# Patient Record
Sex: Male | Born: 1995 | Race: Asian | Hispanic: No | Marital: Single | Smoking: Never smoker
Health system: Southern US, Community
[De-identification: ages and names within clinical notes are randomized; demographics above are authoritative.]

---

## 2014-10-30 ENCOUNTER — Encounter: Payer: Self-pay | Admitting: Emergency Medicine

## 2014-10-30 ENCOUNTER — Emergency Department
Admission: EM | Admit: 2014-10-30 | Discharge: 2014-10-30 | Disposition: A | Discharge status: Home / Self Care | Payer: BLUE CROSS/BLUE SHIELD | Attending: Emergency Medicine | Admitting: Emergency Medicine

## 2014-10-30 ENCOUNTER — Emergency Department: Payer: BLUE CROSS/BLUE SHIELD

## 2014-10-30 DIAGNOSIS — N50812 Left testicular pain: Secondary | ICD-10-CM

## 2014-10-30 DIAGNOSIS — N508 Other specified disorders of male genital organs: Secondary | ICD-10-CM | POA: Insufficient documentation

## 2014-10-30 DIAGNOSIS — R52 Pain, unspecified: Secondary | ICD-10-CM

## 2014-10-30 MED ORDER — IBUPROFEN 800 MG PO TABS
800.0000 mg | ORAL_TABLET | Freq: Once | ORAL | Status: AC
  Administered 2014-10-30: 800 mg via ORAL
  Filled 2014-10-30: qty 1

## 2014-10-30 MED ORDER — IBUPROFEN 800 MG PO TABS: 800.0000 mg | ORAL_TABLET | Freq: Three times a day (TID) | ORAL | Status: AC | PRN

## 2014-10-30 MED ORDER — DOXYCYCLINE HYCLATE 100 MG PO CAPS: 100.0000 mg | ORAL_CAPSULE | Freq: Two times a day (BID) | ORAL | Status: AC

## 2014-10-30 NOTE — Discharge Instructions (Signed)
Orchitis Orchitis is an infection of the testicle of usually sudden onset (happens quickly). It may be viral or bacterial (caused by germs). Usually with this illness there is generalized malaise (not feeling well) and fever. There is also pain. There is usually tenderness and swelling of the scrotum and testicle. DIAGNOSIS  Your caregiver will perform an exam to make sEpididymitis Epididymitis is a swelling (inflammation) of the epididymis. The epididymis is a cord-like structure along the back part of the testicle. Epididymitis is usually, but not always, caused by infection. This is usually a sudden problem beginning with chills, fever and pain behind the scrotum and in the testicle. There may be swelling and redness of the testicle. DIAGNOSIS  Physical examination will reveal a tender, swollen epididymis. Sometimes, cultures are obtained from the urine or from prostate secretions to help find out if there is an infection or if the cause is a different problem. Sometimes, blood work is performed to see if your white blood cell count is elevated and if a germ (bacterial) or viral infection is present. Using this knowledge, an appropriate medicine which kills germs (antibiotic) can be chosen by your caregiver. A viral infection causing epididymitis will most often go away (resolve) without treatment. HOME CARE INSTRUCTIONS   Hot sitz baths for 20 minutes, 4 times per day, may help relieve pain.  Only take over-the-counter or prescription medicines for pain, discomfort or fever as directed by your caregiver.  Take all medicines, including antibiotics, as directed. Take the antibiotics for the full prescribed length of time even if you are feeling better.  It is very important to keep all follow-up appointments. SEEK IMMEDIATE MEDICAL CARE IF:   You have a fever.  You have pain not relieved with medicines.  You have any worsening of your problems.  Your pain seems to come and go.  You  develop pain, redness, and swelling in the scrotum and surrounding areas. MAKE SURE YOU:   Understand these instructions.  Will watch your condition.  Will get help right away if you are not doing well or get worse. Document Released: 03/19/2000 Document Revised: 06/14/2011 Document Reviewed: 02/06/2009 [facility] Patient Information 2015 [place], [place]. This information is not intended to replace advice given to you by your health care provider. Make sure you discuss any questions you have with your health care provider. e there is not another reason for the pain in your testicle. A rectal exam may be done to find out if the prostate is swollen and tender. Blood work may be done to see if your white blood cell count is elevated. This can help determine if an infection is viral or bacterial. A urinalysis can also determine what type of infection is present. Most bacterial infections can be treated with antibiotics (medications which kill germs). LET YOUR CAREGIVER KNOW ABOUT:  Allergies.  Medications taken including herbs, eye drops, over the counter medications, and creams.  Use of steroids (by mouth or creams).  Previous problems with anesthetics or novocaine.  Previous prostate infections.  History of blood clots (thrombophlebitis).  History of bleeding or blood problems.  Previous surgery.  Previous urinary tract infection.  Other health problems. HOME CARE INSTRUCTIONS   Apply cold packs to the scrotal area for twenty minutes, four times per day or as needed.  A scrotal support may be helpful. Keep a small pillow or support under your testicles while lying or sitting down.  Only take over-the-counter or prescription medicines for pain, discomfort, or fever as  directed by your caregiver.  Take all medications, including antibiotics, as directed. Take the antibiotics for the full prescribed length of time even if you are feeling better. SEEK IMMEDIATE MEDICAL CARE IF:    Your redness, swelling, or pain in the testicle increases or is not getting better.  You have a fever.  You have pain not relieved with medicines.  You have any worsening of any symptoms (problems) that originally brought you in for medical care. Document Released: 03/19/2000 Document Revised: 06/14/2011 Document Reviewed: 03/22/2005 [facility] Patient Information 2015 [place], [place]. This information is not intended to replace advice given to you by your health care provider. Make sure you discuss any questions you have with your health care provider.

## 2014-10-30 NOTE — ED Notes (Signed)
Pt brought over from [facility]
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
## 2014-10-30 NOTE — ED Notes (Signed)
Patient transported to Ultrasound 

## 2017-04-06 IMAGING — US US SCROTUM
1 series · 14 of 25 positions shown · non-contrast
Comparison: None.

CLINICAL DATA: Acute left testicular pain.

EXAM:
SCROTAL ULTRASOUND
DOPPLER ULTRASOUND OF THE TESTICLES
TECHNIQUE: Complete ultrasound examination of the testicles, epididymis, and
other scrotal structures was performed. Color and spectral Doppler
ultrasound were also utilized to evaluate blood flow to the
testicles.

[Series 1: us scrotum · 0.07mm/px · 14 of 52 slices shown]
[im 1/52]
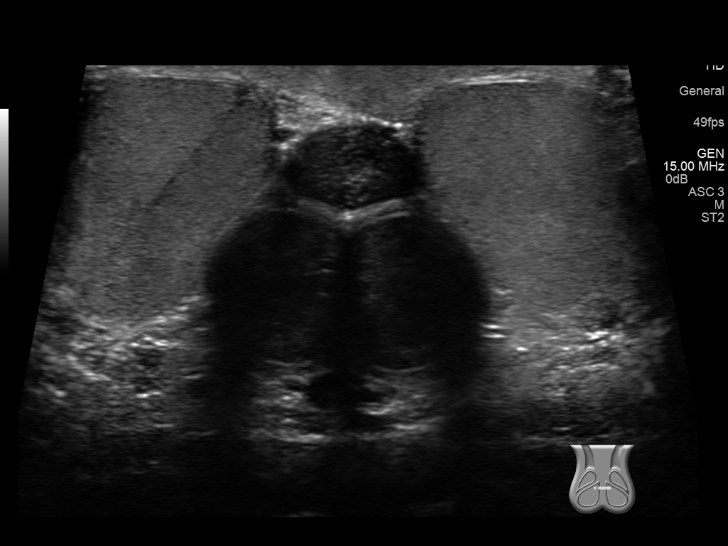
[im 5/52]
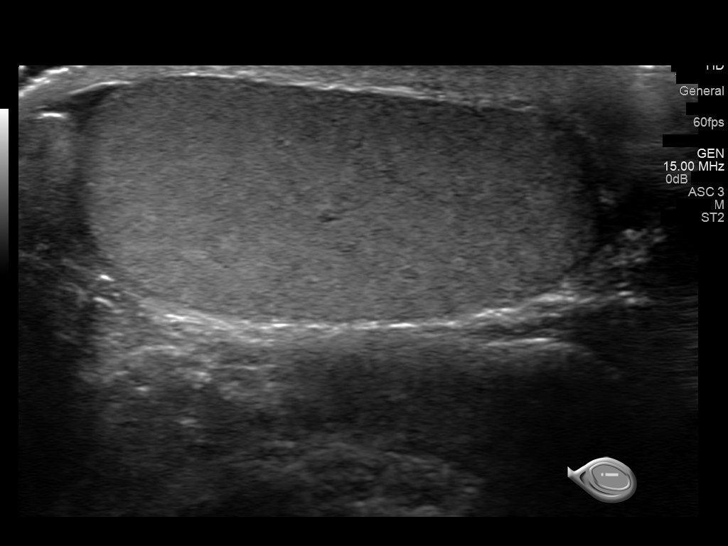
[im 9/52]
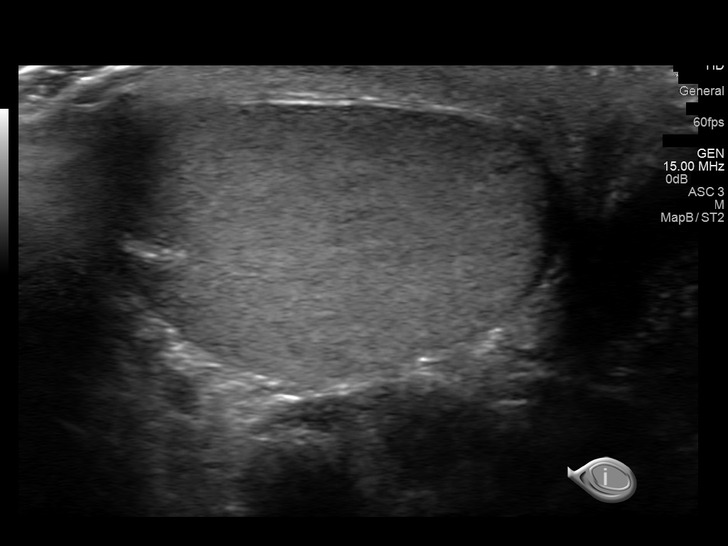
[im 13/52]
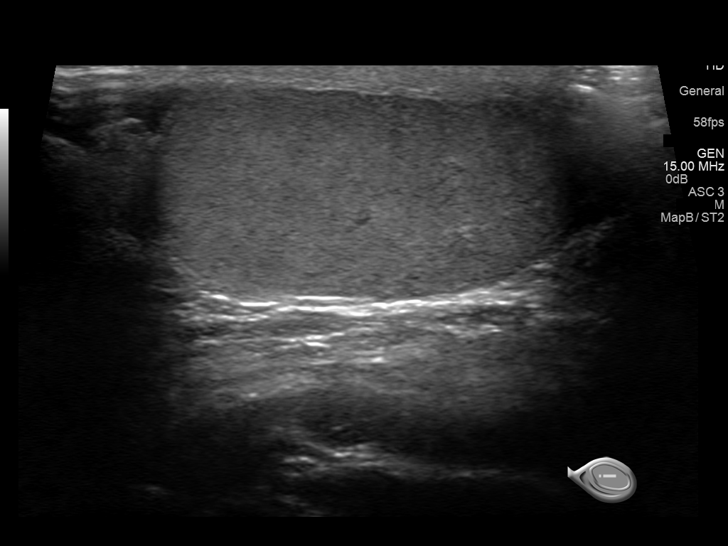
[im 18/52]
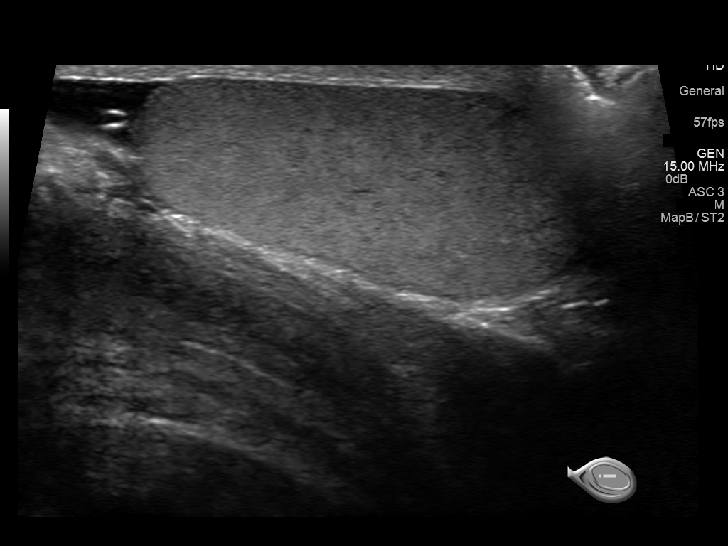
[im 20/52]
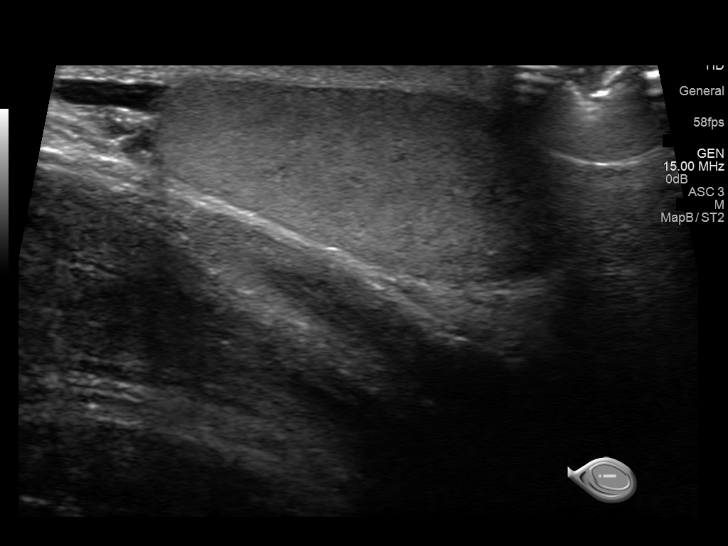
[im 24/52]
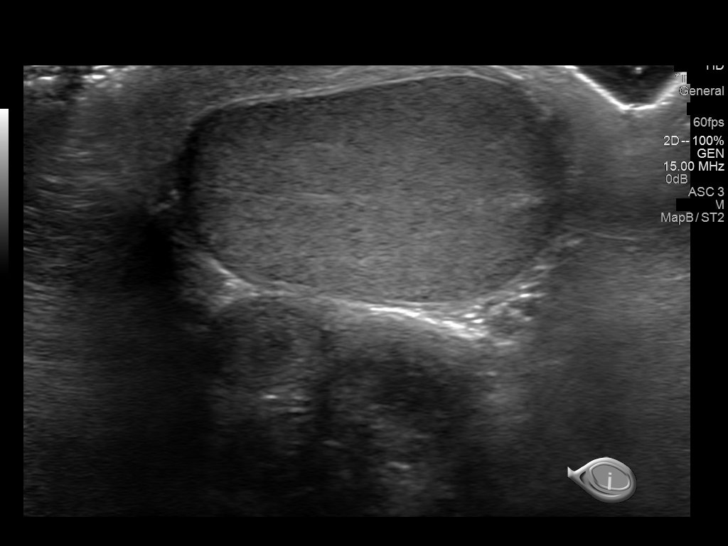
[im 28/52]
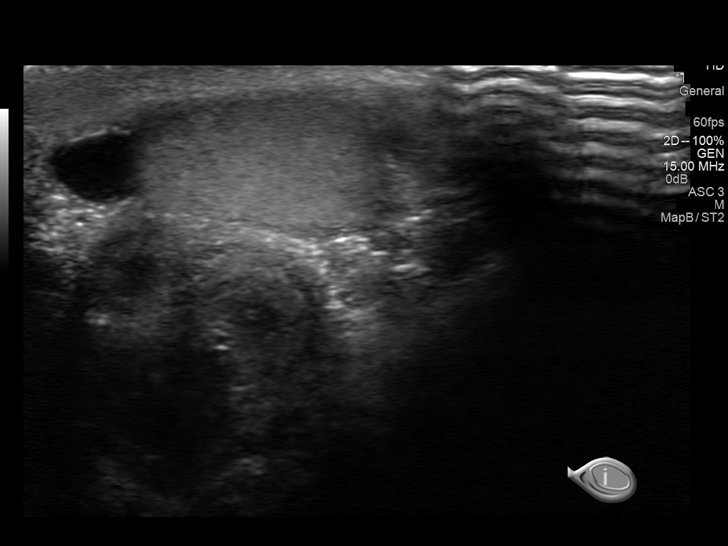
[im 32/52]
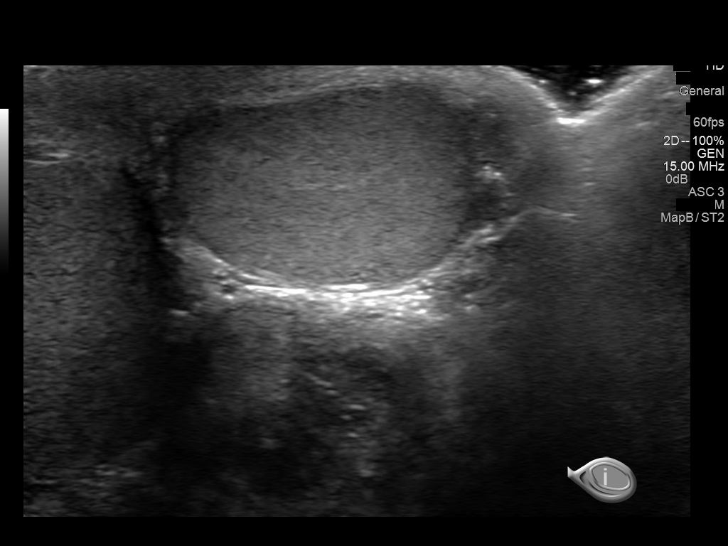
[im 35/52]
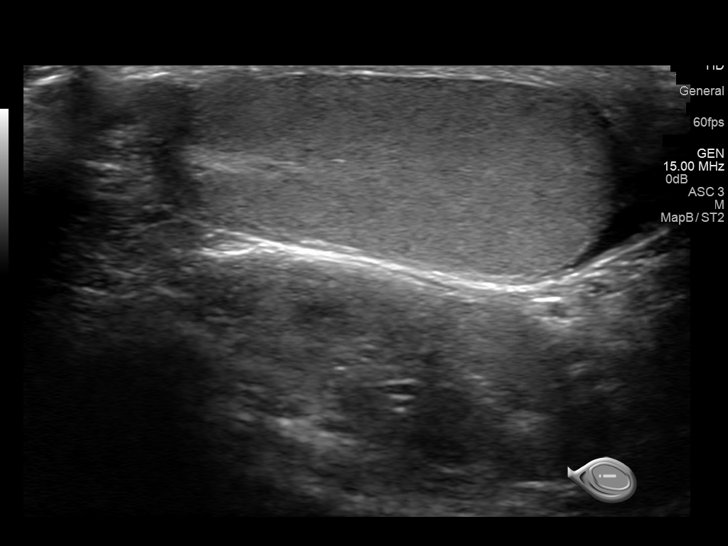
[im 39/52]
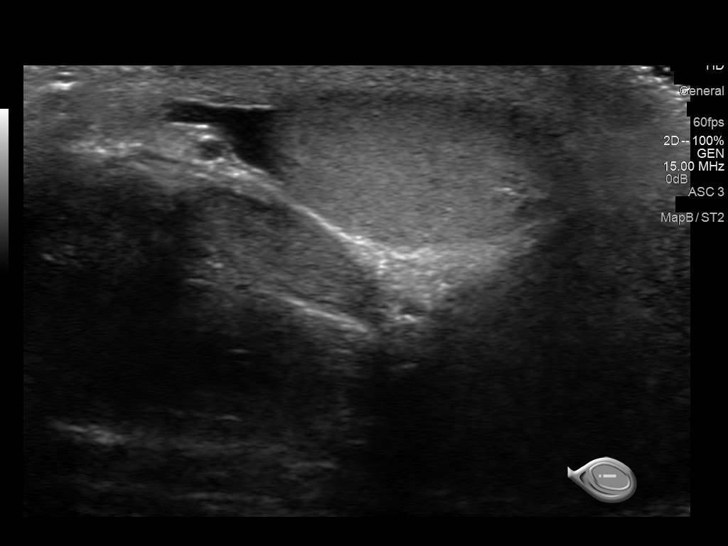
[im 43/52]
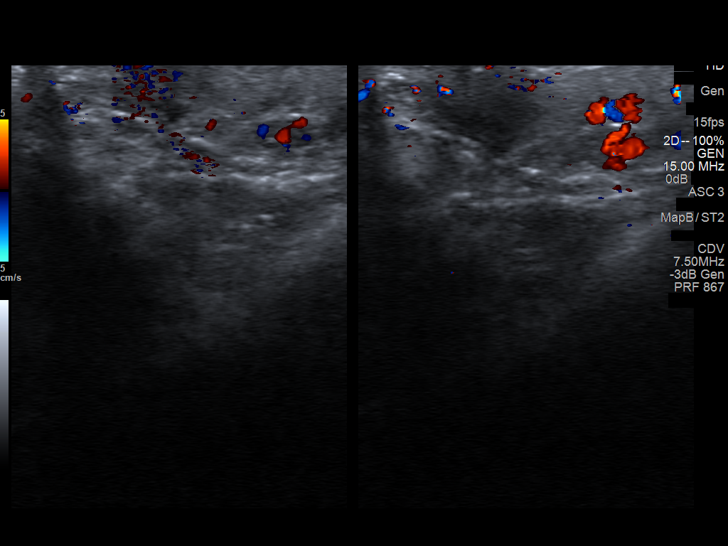
[im 47/52]
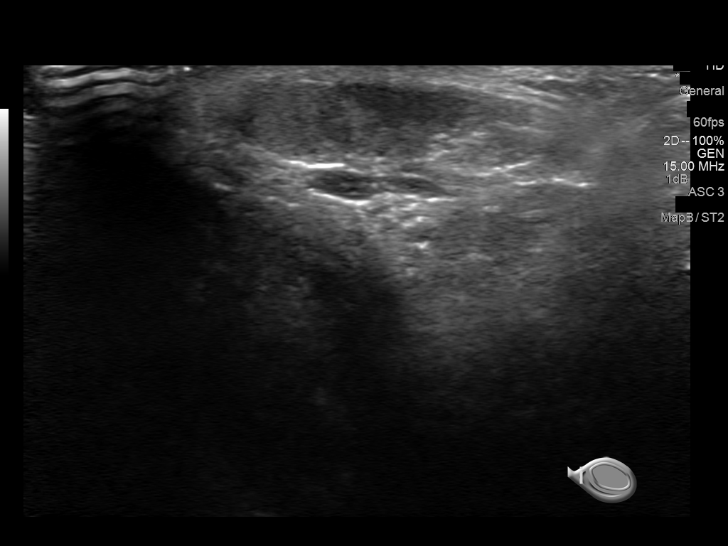
[im 52/52]
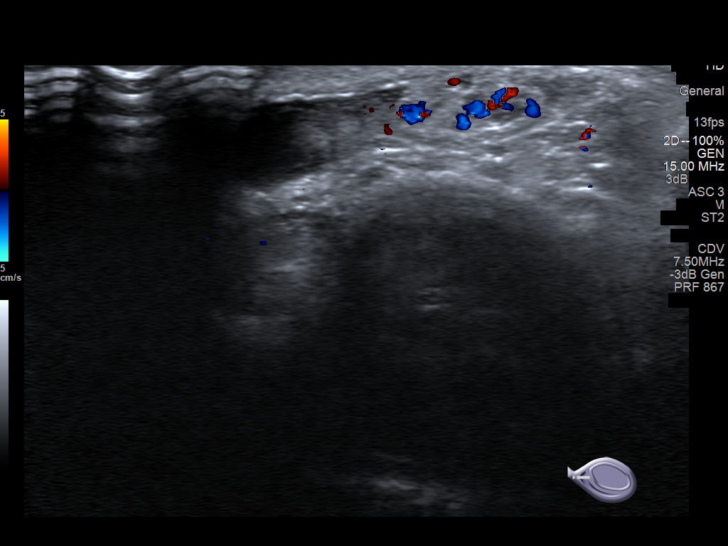

[14 of 25 positions shown; findings below may reference images not displayed]

FINDINGS: Right testicle

Measurements: 4.4 x 3.3 x 1.7 cm. No mass or microlithiasis
visualized.

Left testicle

Measurements: 4.3 x 3.2 x 1.8 cm. No mass or microlithiasis
visualized.

Right epididymis:  Normal in size and appearance.

Left epididymis:  Normal in size and appearance.

Hydrocele:  None visualized.

Varicocele:  None visualized.

Pulsed Doppler interrogation of both testes demonstrates normal low
resistance arterial and venous waveforms bilaterally.
IMPRESSION: No evidence of testicular mass or torsion. No scrotal abnormality
seen.

## 2019-11-06 ENCOUNTER — Ambulatory Visit: Payer: BLUE CROSS/BLUE SHIELD
